# Patient Record
Sex: Male | Born: 1970 | Race: White | Hispanic: No | Marital: Single | Smoking: Former smoker
Health system: Southern US, Community
[De-identification: ages and names within clinical notes are randomized; demographics above are authoritative.]

---

## 1998-06-30 ENCOUNTER — Emergency Department (HOSPITAL_COMMUNITY): Admission: EM | Admit: 1998-06-30 | Discharge: 1998-06-30 | Payer: Self-pay | Admitting: Emergency Medicine

## 2002-09-14 ENCOUNTER — Emergency Department (HOSPITAL_COMMUNITY): Admission: EM | Admit: 2002-09-14 | Discharge: 2002-09-15 | Payer: Self-pay | Admitting: Emergency Medicine

## 2002-09-15 ENCOUNTER — Encounter: Payer: Self-pay | Admitting: Emergency Medicine

## 2004-11-08 ENCOUNTER — Emergency Department (HOSPITAL_COMMUNITY): Admission: EM | Admit: 2004-11-08 | Discharge: 2004-11-08 | Payer: Self-pay | Admitting: Emergency Medicine

## 2009-12-15 ENCOUNTER — Emergency Department (HOSPITAL_COMMUNITY): Admission: EM | Admit: 2009-12-15 | Discharge: 2009-12-15 | Payer: Self-pay | Admitting: Emergency Medicine

## 2010-05-29 LAB — URINALYSIS, ROUTINE W REFLEX MICROSCOPIC
Glucose, UA: NEGATIVE mg/dL
Protein, ur: 30 mg/dL — AB
Specific Gravity, Urine: 1.024 (ref 1.005–1.030)
Urobilinogen, UA: 0.2 mg/dL (ref 0.0–1.0)

## 2010-05-29 LAB — POCT I-STAT, CHEM 8
BUN: 23 mg/dL (ref 6–23)
Calcium, Ion: 1.12 mmol/L (ref 1.12–1.32)
Creatinine, Ser: 1 mg/dL (ref 0.4–1.5)
Hemoglobin: 16.7 g/dL (ref 13.0–17.0)
TCO2: 27 mmol/L (ref 0–100)

## 2010-05-29 LAB — URINE MICROSCOPIC-ADD ON

## 2010-06-03 ENCOUNTER — Emergency Department (HOSPITAL_BASED_OUTPATIENT_CLINIC_OR_DEPARTMENT_OTHER)
Admission: EM | Admit: 2010-06-03 | Discharge: 2010-06-03 | Disposition: A | Discharge status: Home / Self Care | Payer: Self-pay | Attending: Emergency Medicine | Admitting: Emergency Medicine

## 2010-06-03 ENCOUNTER — Emergency Department (INDEPENDENT_AMBULATORY_CARE_PROVIDER_SITE_OTHER): Payer: Self-pay

## 2010-06-03 DIAGNOSIS — N1330 Unspecified hydronephrosis: Secondary | ICD-10-CM

## 2010-06-03 DIAGNOSIS — N200 Calculus of kidney: Secondary | ICD-10-CM | POA: Insufficient documentation

## 2010-06-03 DIAGNOSIS — N201 Calculus of ureter: Secondary | ICD-10-CM

## 2010-06-03 DIAGNOSIS — F17200 Nicotine dependence, unspecified, uncomplicated: Secondary | ICD-10-CM | POA: Insufficient documentation

## 2010-06-03 LAB — BASIC METABOLIC PANEL
Chloride: 105 mEq/L (ref 96–112)
GFR calc non Af Amer: >60 mL/min (ref >60)
Glucose, Bld: 107 mg/dL — ABNORMAL HIGH (ref 70–99)
Potassium: 4.1 mEq/L (ref 3.5–5.1)
Sodium: 141 mEq/L (ref 135–145)

## 2010-06-03 LAB — CBC
HCT: 41.3 % (ref 39.0–52.0)
Hemoglobin: 14.5 g/dL (ref 13.0–17.0)
RBC: 4.43 MIL/uL (ref 4.22–5.81)
RDW: 12.1 % (ref 11.5–15.5)
WBC: 12 10*3/uL — ABNORMAL HIGH (ref 4.0–10.5)

## 2010-06-03 LAB — URINALYSIS, ROUTINE W REFLEX MICROSCOPIC
Bilirubin Urine: NEGATIVE
Glucose, UA: NEGATIVE mg/dL
Ketones, ur: NEGATIVE mg/dL
Protein, ur: NEGATIVE mg/dL
Urobilinogen, UA: 1 mg/dL (ref 0.0–1.0)

## 2010-06-03 LAB — URINE MICROSCOPIC-ADD ON

## 2010-06-03 LAB — DIFFERENTIAL
Basophils Absolute: 0 10*3/uL (ref 0.0–0.1)
Eosinophils Relative: 2 % (ref 0–5)
Lymphocytes Relative: 25 % (ref 12–46)
Neutro Abs: 7.8 10*3/uL — ABNORMAL HIGH (ref 1.7–7.7)
Neutrophils Relative %: 64 % (ref 43–77)

## 2012-10-11 IMAGING — CT CT ABD-PELV W/O CM
2 of 3 series · 16 of 46 positions shown, 18 images · non-contrast
Comparison: CT abdomen pelvis 12/15/2009.

CLINICAL DATA: Right flank pain.  Dysuria.

CT ABDOMEN AND PELVIS WITHOUT CONTRAST
TECHNIQUE: Multidetector CT imaging of the abdomen and pelvis was
performed following the standard protocol without intravenous
contrast.

[Series 2: renal stone < 200 lbs 5.0 b31f · axial · 0.66mm/px · z∈[+712,+1102]mm · 13 of 90 slices shown, 15 images]
[im 6/90  soft-tissue]
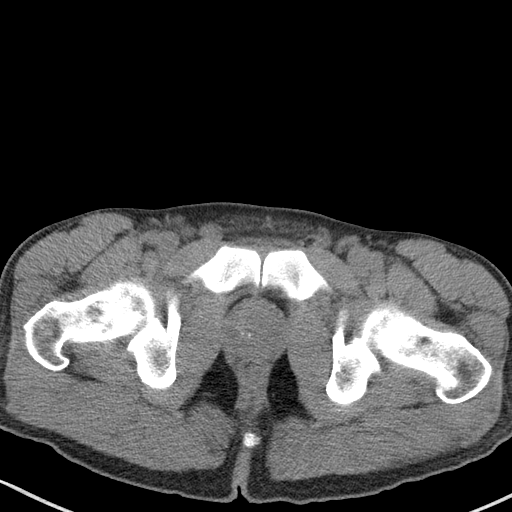
[im 6/90  bone]
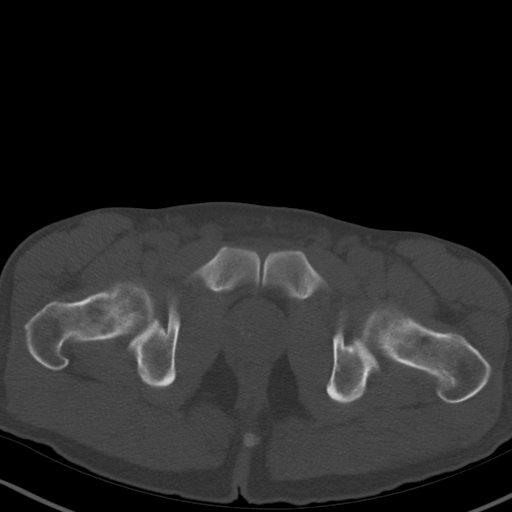
[im 12/90  soft-tissue]
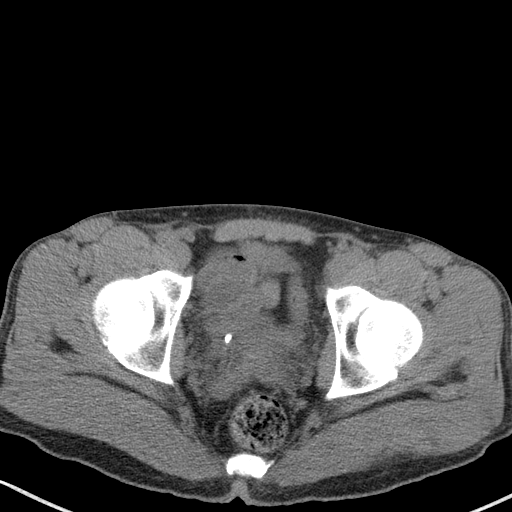
[im 18/90  soft-tissue]
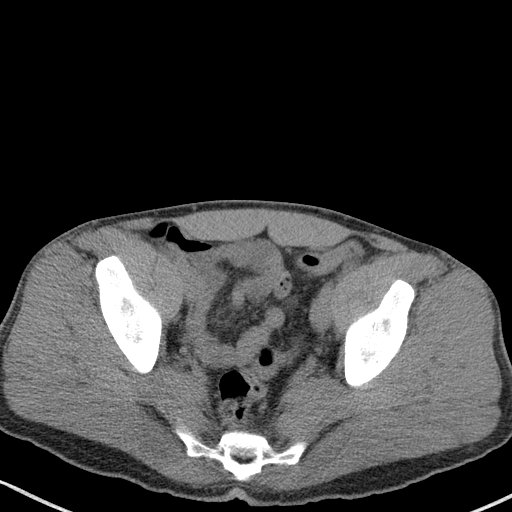
[im 26/90  soft-tissue]
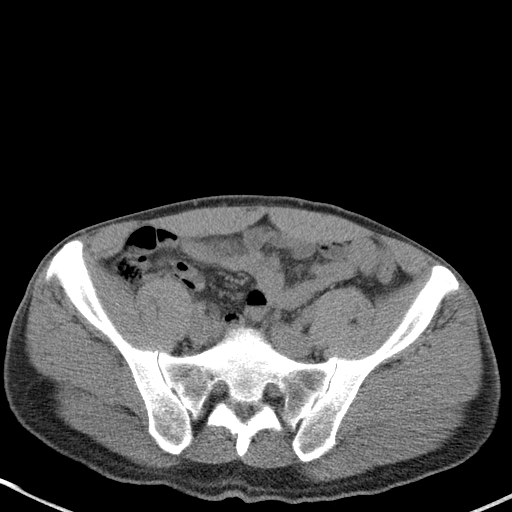
[im 32/90  soft-tissue]
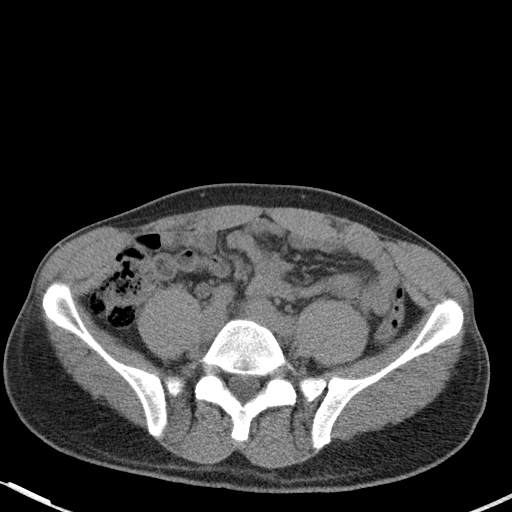
[im 38/90  soft-tissue]
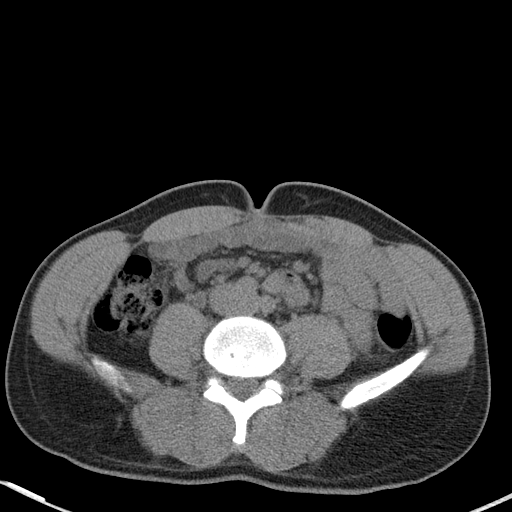
[im 46/90  soft-tissue]
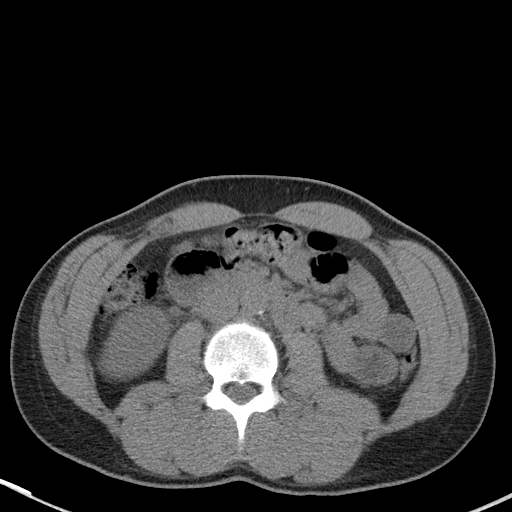
[im 52/90  soft-tissue]
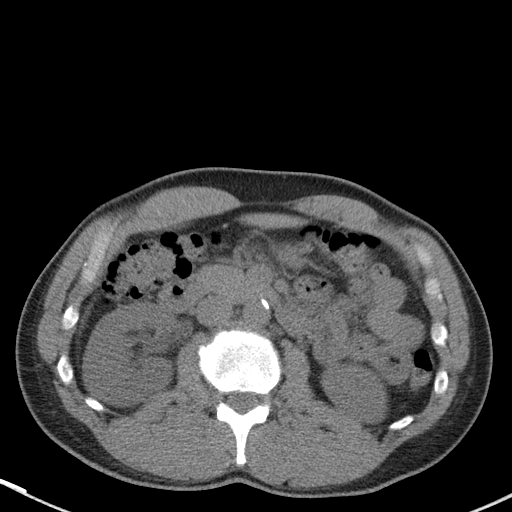
[im 58/90  soft-tissue]
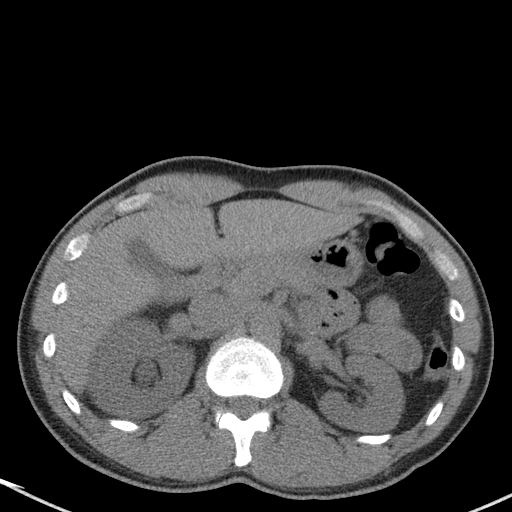
[im 58/90  bone]
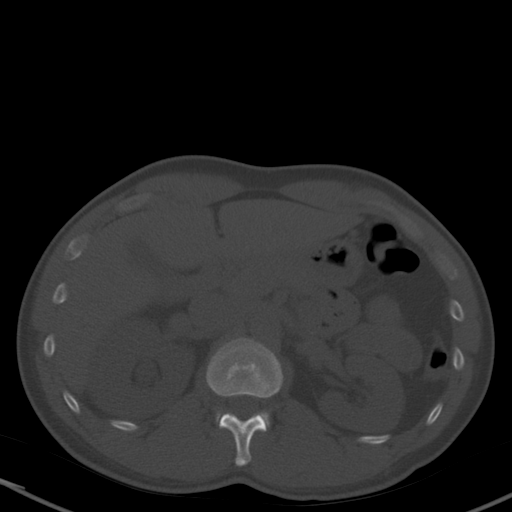
[im 64/90  soft-tissue]
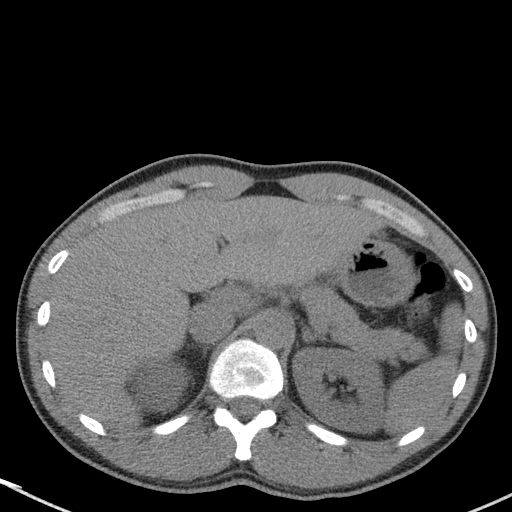
[im 72/90  soft-tissue]
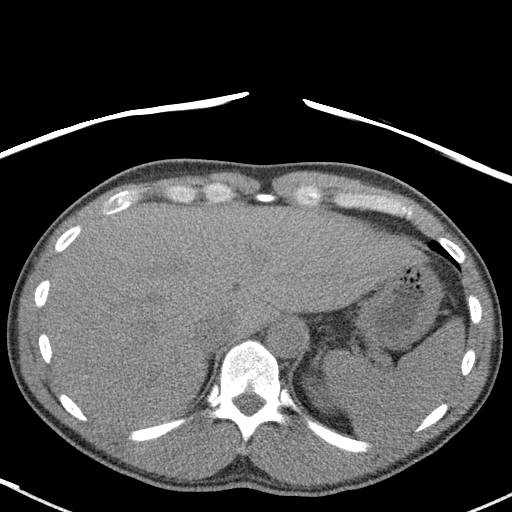
[im 78/90  soft-tissue]
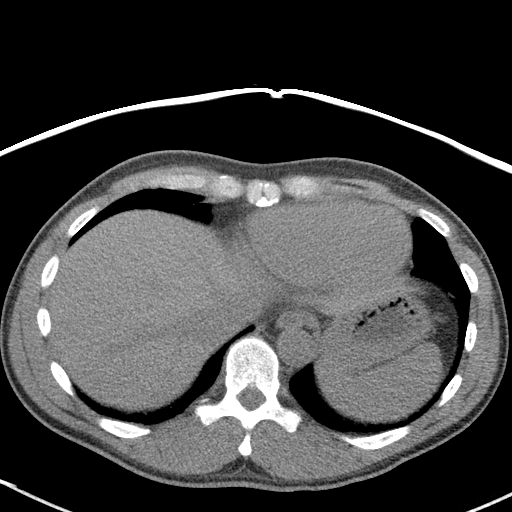
[im 84/90  soft-tissue]
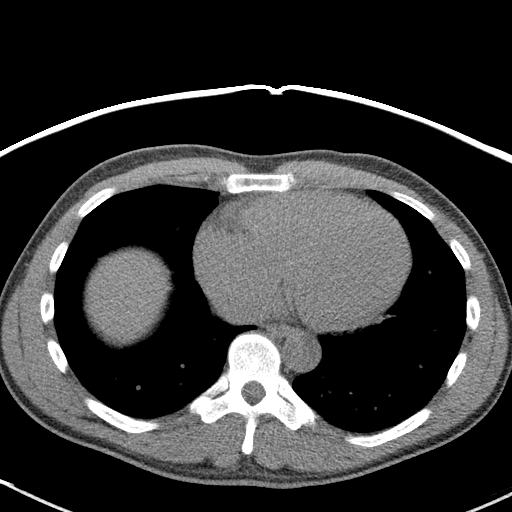

[Series 5: renal stone 3.0 coronal · coronal · 0.63mm/px · 3 of 68 slices shown]
[im 23/68  soft-tissue]
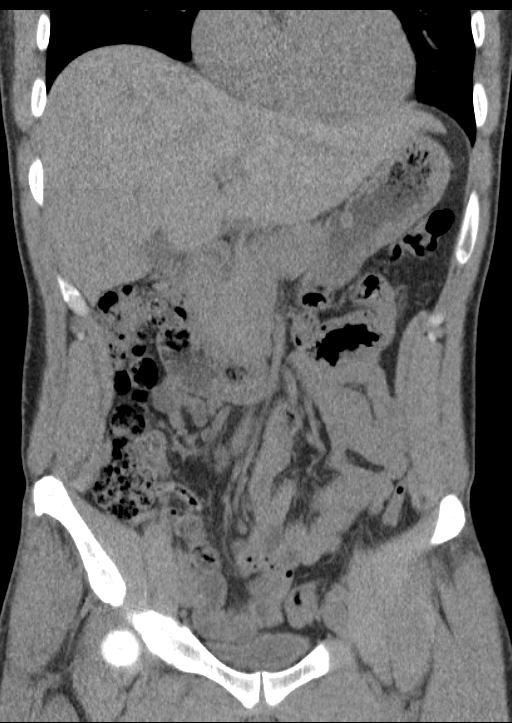
[im 30/68  soft-tissue]
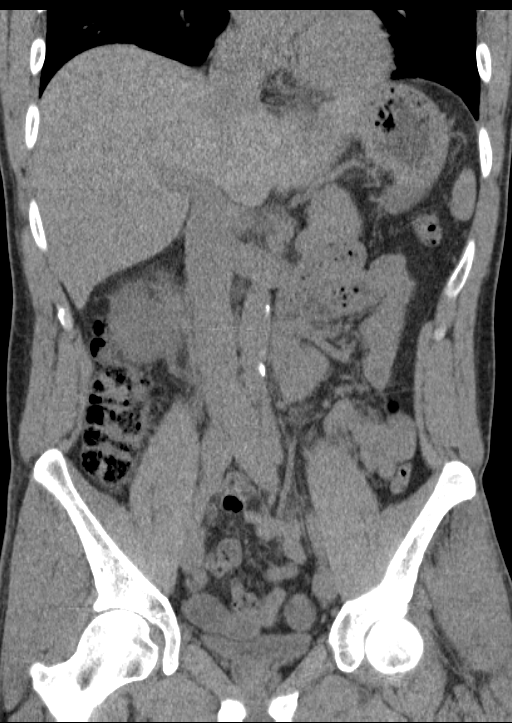
[im 38/68  soft-tissue]
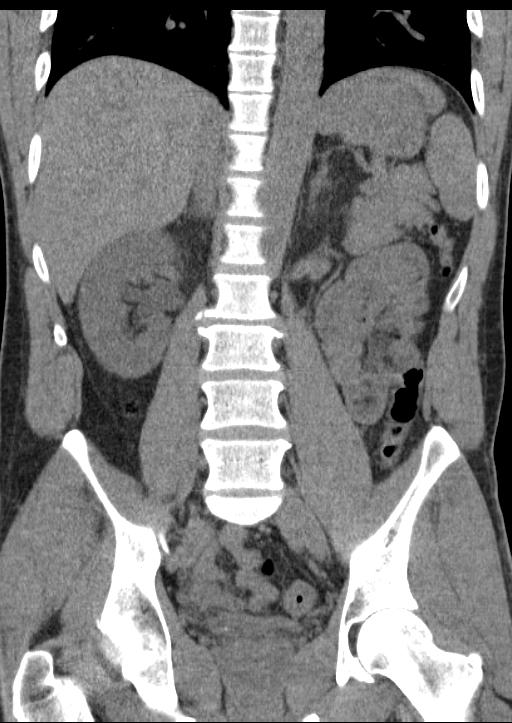

[16 of 46 positions shown; findings below may reference images not displayed]

FINDINGS: Tiny calcified granuloma is seen in the right lower lobe.
Lung bases otherwise clear.  No pleural or pericardial effusion.

The patient has moderate right hydronephrosis and dilatation of the
right ureter due to a 0.7 cm stone at the right ureteral vesicle
junction.  A punctate nonobstructing stone is identified in the
upper pole of the right kidney. Also seen is a punctate
nonobstructing stone in the mid pole of the left kidney.  No left
side urinary tract stones are seen.  Kidneys otherwise appear
normal.

The liver, gallbladder, adrenal glands, spleen and pancreas are all
unremarkable.  No lymphadenopathy or fluid.  Prostate calcification
is noted.  The stomach, small and large bowel and appendix are
unremarkable. No lymphadenopathy or fluid.  No focal bony
abnormality.
IMPRESSION: Moderate right hydronephrosis due to a 0.7 cm stone at the right
UVJ.  Punctate nonobstructing stones in the upper pole right kidney
and in the mid pole of the left kidney also noted.

## 2017-01-18 ENCOUNTER — Other Ambulatory Visit: Payer: Self-pay

## 2017-01-18 ENCOUNTER — Encounter (HOSPITAL_COMMUNITY): Payer: Self-pay | Admitting: *Deleted

## 2017-01-18 ENCOUNTER — Emergency Department (HOSPITAL_COMMUNITY): Payer: Self-pay

## 2017-01-18 ENCOUNTER — Emergency Department (HOSPITAL_COMMUNITY)
Admission: EM | Admit: 2017-01-18 | Discharge: 2017-01-18 | Disposition: A | Discharge status: Home / Self Care | Payer: Self-pay | Attending: Emergency Medicine | Admitting: Emergency Medicine

## 2017-01-18 DIAGNOSIS — J40 Bronchitis, not specified as acute or chronic: Secondary | ICD-10-CM | POA: Insufficient documentation

## 2017-01-18 DIAGNOSIS — R0600 Dyspnea, unspecified: Secondary | ICD-10-CM | POA: Insufficient documentation

## 2017-01-18 DIAGNOSIS — F17210 Nicotine dependence, cigarettes, uncomplicated: Secondary | ICD-10-CM | POA: Insufficient documentation

## 2017-01-18 DIAGNOSIS — R05 Cough: Secondary | ICD-10-CM | POA: Insufficient documentation

## 2017-01-18 DIAGNOSIS — R490 Dysphonia: Secondary | ICD-10-CM | POA: Insufficient documentation

## 2017-01-18 MED ORDER — FEXOFENADINE HCL 60 MG PO TABS: 60.0000 mg | ORAL_TABLET | Freq: Two times a day (BID) | ORAL | 0 refills | Status: AC

## 2017-01-18 MED ORDER — NAPROXEN 375 MG PO TABS: 375.0000 mg | ORAL_TABLET | Freq: Two times a day (BID) | ORAL | 0 refills | Status: AC

## 2017-01-18 MED ORDER — ALBUTEROL SULFATE (2.5 MG/3ML) 0.083% IN NEBU
5.0000 mg | INHALATION_SOLUTION | RESPIRATORY_TRACT | Status: AC
  Administered 2017-01-18 (×3): 5 mg via RESPIRATORY_TRACT
  Filled 2017-01-18 (×2): qty 6

## 2017-01-18 MED ORDER — ALBUTEROL SULFATE HFA 108 (90 BASE) MCG/ACT IN AERS
2.0000 | INHALATION_SPRAY | Freq: Once | RESPIRATORY_TRACT | Status: AC
  Administered 2017-01-18: 2 via RESPIRATORY_TRACT
  Filled 2017-01-18: qty 6.7

## 2017-01-18 MED ORDER — PREDNISONE 10 MG PO TABS
60.0000 mg | ORAL_TABLET | Freq: Every day | ORAL | 0 refills | Status: DC
Start: 2017-01-18 — End: 2020-03-17

## 2017-01-18 MED ORDER — IPRATROPIUM BROMIDE 0.02 % IN SOLN
0.5000 mg | Freq: Once | RESPIRATORY_TRACT | Status: AC
  Administered 2017-01-18: 0.5 mg via RESPIRATORY_TRACT
  Filled 2017-01-18: qty 2.5

## 2017-01-18 MED ORDER — LIDOCAINE VISCOUS 2 % MT SOLN
15.0000 mL | Freq: Once | OROMUCOSAL | Status: AC
  Administered 2017-01-18: 15 mL via OROMUCOSAL
  Filled 2017-01-18: qty 15

## 2017-01-18 NOTE — Discharge Instructions (Signed)
We saw you in the ER for worsening voice and difficulty in breathing.. Suspect that the difficulty in breathing is due to new diagnosis of COPD.  The please use the inhaler as prescribed and take the steroids as prescribed. See the ENT doctors if her voice continues to stay hoarse. Please refrain from smoking.
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
## 2020-03-17 ENCOUNTER — Other Ambulatory Visit: Payer: Self-pay

## 2020-03-17 ENCOUNTER — Emergency Department (HOSPITAL_BASED_OUTPATIENT_CLINIC_OR_DEPARTMENT_OTHER)
Admission: EM | Admit: 2020-03-17 | Discharge: 2020-03-17 | Disposition: A | Discharge status: Home / Self Care | Payer: Self-pay | Attending: Emergency Medicine | Admitting: Emergency Medicine

## 2020-03-17 ENCOUNTER — Encounter (HOSPITAL_BASED_OUTPATIENT_CLINIC_OR_DEPARTMENT_OTHER): Payer: Self-pay | Admitting: Emergency Medicine

## 2020-03-17 ENCOUNTER — Emergency Department (HOSPITAL_BASED_OUTPATIENT_CLINIC_OR_DEPARTMENT_OTHER): Payer: Self-pay

## 2020-03-17 DIAGNOSIS — J069 Acute upper respiratory infection, unspecified: Secondary | ICD-10-CM

## 2020-03-17 DIAGNOSIS — Z87891 Personal history of nicotine dependence: Secondary | ICD-10-CM | POA: Insufficient documentation

## 2020-03-17 DIAGNOSIS — R062 Wheezing: Secondary | ICD-10-CM

## 2020-03-17 MED ORDER — ALBUTEROL SULFATE HFA 108 (90 BASE) MCG/ACT IN AERS
INHALATION_SPRAY | RESPIRATORY_TRACT | Status: AC
  Administered 2020-03-17: 2 via RESPIRATORY_TRACT
  Filled 2020-03-17: qty 6.7

## 2020-03-17 MED ORDER — ALBUTEROL SULFATE HFA 108 (90 BASE) MCG/ACT IN AERS
2.0000 | INHALATION_SPRAY | Freq: Four times a day (QID) | RESPIRATORY_TRACT | Status: DC | PRN
  Administered 2020-03-17: 2 via RESPIRATORY_TRACT

## 2020-03-17 MED ORDER — PREDNISONE 20 MG PO TABS: ORAL_TABLET | ORAL | 0 refills | Status: AC

## 2020-03-17 NOTE — ED Triage Notes (Signed)
Cough x 2 weeks, hoarse voice. Recently quit smoking.

## 2020-03-17 NOTE — ED Provider Notes (Signed)
[facility] EMERGENCY DEPARTMENT Provider Note   CSN: [id number] Arrival date & time: 03/17/20  0744     History Chief Complaint  Patient presents with  . Cough    Cameron Velazquez is a 50 y.o. male.  Patient is a 50 year old male who presents with cough.  He says he has had a cough for about 2 weeks.  Now he has a little bit of hoarseness to his throat.  He denies any sore throat or difficulty swallowing.  No fevers.  No chest pain.  He has had some intermittent shortness of breath and wheezing.  He has no history of underlying lung disease.  He is a smoker but he says he stopped smoking just prior to the onset of the symptoms.  He is not vaccinated for Covid.  He has no leg swelling or pain.  His cough is mostly nonproductive but he occasionally coughs up some white-yellow stuff.  He is taking Mucinex at home with some improvement in symptoms.        History reviewed. No pertinent past medical history.  There are no problems to display for this patient.   History reviewed. No pertinent surgical history.     No family history on file.  Social History   Tobacco Use  . Smoking status: Former Games developer  . Smokeless tobacco: Never Used  Substance Use Topics  . Alcohol use: Yes    Home Medications Prior to Admission medications   Medication Sig Start Date End Date Taking? Authorizing Provider  predniSONE (DELTASONE) 20 MG tablet 3 tabs po day one, then 2 po daily x 4 days 03/17/20  Yes Rolan Bucco, MD  fexofenadine (ALLEGRA) 60 MG tablet Take 1 tablet (60 mg total) 2 (two) times daily by mouth. 01/18/17   Derwood Kaplan, MD  naproxen (NAPROSYN) 375 MG tablet Take 1 tablet (375 mg total) 2 (two) times daily by mouth. 01/18/17   Derwood Kaplan, MD    Allergies    Patient has no known allergies.  Review of Systems   Review of Systems  Constitutional: Negative for chills, diaphoresis, fatigue and fever.  HENT: Positive for voice change. Negative for congestion,  rhinorrhea and sneezing.   Eyes: Negative.   Respiratory: Positive for cough, shortness of breath and wheezing. Negative for chest tightness.   Cardiovascular: Negative for chest pain and leg swelling.  Gastrointestinal: Negative for abdominal pain, blood in stool, diarrhea, nausea and vomiting.  Genitourinary: Negative for difficulty urinating, flank pain, frequency and hematuria.  Musculoskeletal: Negative for arthralgias and back pain.  Skin: Negative for rash.  Neurological: Negative for dizziness, speech difficulty, weakness, numbness and headaches.    Physical Exam Updated Vital Signs BP (!) 143/79   Pulse 71   Temp 98.5 F (36.9 C) (Oral)   Resp 20   Ht 5\' 9"  (1.753 m)   Wt 83.9 kg   SpO2 98%   BMI 27.32 kg/m   Physical Exam Constitutional:      Appearance: He is well-developed and well-nourished.  HENT:     Head: Normocephalic and atraumatic.     Mouth/Throat:     Pharynx: Oropharynx is clear. No oropharyngeal exudate or posterior oropharyngeal erythema.  Eyes:     Pupils: Pupils are equal, round, and reactive to light.  Cardiovascular:     Rate and Rhythm: Normal rate and regular rhythm.     Heart sounds: Normal heart sounds.  Pulmonary:     Effort: Pulmonary effort is normal. No respiratory distress.  Breath sounds: Wheezing present. No rales.  Chest:     Chest wall: No tenderness.  Abdominal:     General: Bowel sounds are normal.     Palpations: Abdomen is soft.     Tenderness: There is no abdominal tenderness. There is no guarding or rebound.  Musculoskeletal:        General: No edema. Normal range of motion.     Cervical back: Normal range of motion and neck supple.     Comments: No edema or calf tenderness  Lymphadenopathy:     Cervical: No cervical adenopathy.  Skin:    General: Skin is warm and dry.     Findings: No rash.  Neurological:     Mental Status: He is alert and oriented to person, place, and time.  Psychiatric:        Mood and  Affect: Mood and affect normal.     ED Results / Procedures / Treatments   Labs (all labs ordered are listed, but only abnormal results are displayed) Labs Reviewed - No data to display  EKG None  Radiology DG Chest Portable 1 View  Result Date: 03/17/2020 CLINICAL DATA:  Cough for 2 weeks. EXAM: PORTABLE CHEST 1 VIEW COMPARISON:  January 18, 2017 FINDINGS: The heart size and mediastinal contours are within normal limits. Both lungs are clear. The visualized skeletal structures are unremarkable. IMPRESSION: No active disease. Electronically Signed   By: Sherian Rein M.D.   On: 03/17/2020 08:52    Procedures Procedures (including critical care time)  Medications Ordered in ED Medications  albuterol (VENTOLIN HFA) 108 (90 Base) MCG/ACT inhaler 2 puff (has no administration in time range)  albuterol (VENTOLIN HFA) 108 (90 Base) MCG/ACT inhaler (has no administration in time range)    ED Course  I have reviewed the triage vital signs and the nursing notes.  Pertinent labs & imaging results that were available during my care of the patient were reviewed by me and considered in my medical decision making (see chart for details).    MDM Rules/Calculators/A&P                          Patient presents with a cough and wheezing.  He is afebrile.  His chest x-ray shows no evidence of pneumonia.  This was reviewed by me.  He is otherwise well-appearing in no respiratory distress.  He does have some wheezing on exam but no increased work of breathing.  No hypoxia.  No fever.  He was started on an albuterol inhaler and a 5-day course of prednisone.  This is likely a viral bronchitis.  I offered him Covid testing but he declines at this point.  His symptoms have been going on for 2 weeks.  Return precautions were given. Final Clinical Impression(s) / ED Diagnoses Final diagnoses:  Viral URI with cough  Wheezing    Rx / DC Orders ED Discharge Orders         Ordered    predniSONE  (DELTASONE) 20 MG tablet        03/17/20 1130           Rolan Bucco, MD 03/17/20 1133

## 2022-07-26 IMAGING — DX DG CHEST 1V PORT
1 series · 1 of 1 positions shown · non-contrast
Comparison: January 18, 2017

CLINICAL DATA: Cough for 2 weeks.

EXAM:
PORTABLE CHEST 1 VIEW

[chest ap]
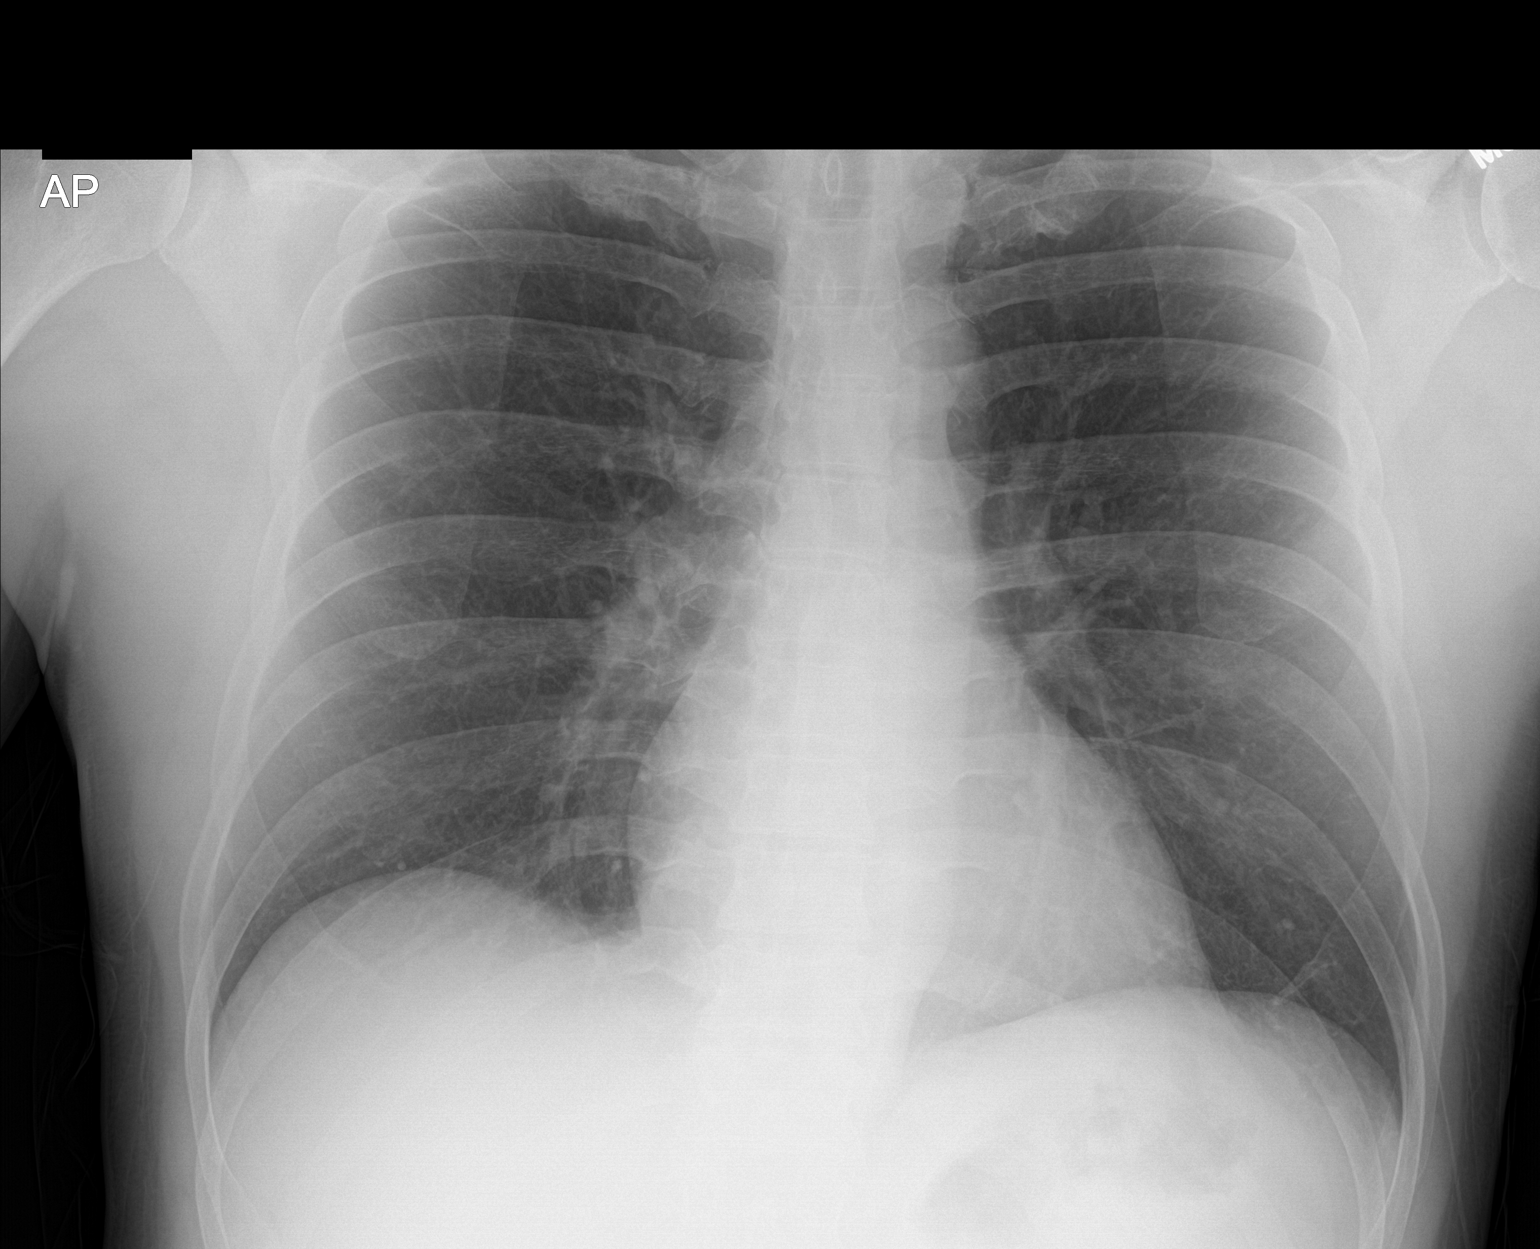

[1 of 1 positions shown; findings below may reference images not displayed]

FINDINGS: The heart size and mediastinal contours are within normal limits.
Both lungs are clear. The visualized skeletal structures are
unremarkable.
IMPRESSION: No active disease.
